# Patient Record
Sex: Male | Born: 2000
Health system: Southern US, Community
[De-identification: ages and names within clinical notes are randomized; demographics above are authoritative.]

## PROBLEM LIST (undated history)

## (undated) DIAGNOSIS — K5090 Crohn's disease, unspecified, without complications: Secondary | ICD-10-CM

---

## 2019-04-29 ENCOUNTER — Observation Stay (HOSPITAL_COMMUNITY)
Admission: EM | Admit: 2019-04-29 | Discharge: 2019-04-30 | Disposition: A | Discharge status: Home / Self Care | Payer: BC Managed Care – PPO | Attending: General Surgery | Admitting: General Surgery

## 2019-04-29 ENCOUNTER — Encounter (HOSPITAL_COMMUNITY): Payer: Self-pay | Admitting: Emergency Medicine

## 2019-04-29 ENCOUNTER — Other Ambulatory Visit: Payer: Self-pay

## 2019-04-29 ENCOUNTER — Emergency Department (HOSPITAL_COMMUNITY): Payer: BC Managed Care – PPO

## 2019-04-29 ENCOUNTER — Other Ambulatory Visit (HOSPITAL_COMMUNITY): Payer: Self-pay

## 2019-04-29 DIAGNOSIS — K5090 Crohn's disease, unspecified, without complications: Secondary | ICD-10-CM | POA: Insufficient documentation

## 2019-04-29 DIAGNOSIS — Z880 Allergy status to penicillin: Secondary | ICD-10-CM | POA: Diagnosis not present

## 2019-04-29 DIAGNOSIS — Z20828 Contact with and (suspected) exposure to other viral communicable diseases: Secondary | ICD-10-CM | POA: Insufficient documentation

## 2019-04-29 DIAGNOSIS — S31139A Puncture wound of abdominal wall without foreign body, unspecified quadrant without penetration into peritoneal cavity, initial encounter: Secondary | ICD-10-CM

## 2019-04-29 DIAGNOSIS — S27321A Contusion of lung, unilateral, initial encounter: Secondary | ICD-10-CM | POA: Diagnosis not present

## 2019-04-29 DIAGNOSIS — Y249XXA Unspecified firearm discharge, undetermined intent, initial encounter: Secondary | ICD-10-CM | POA: Insufficient documentation

## 2019-04-29 DIAGNOSIS — S21339A Puncture wound without foreign body of unspecified front wall of thorax with penetration into thoracic cavity, initial encounter: Secondary | ICD-10-CM

## 2019-04-29 DIAGNOSIS — R2681 Unsteadiness on feet: Secondary | ICD-10-CM | POA: Diagnosis not present

## 2019-04-29 DIAGNOSIS — S31030A Puncture wound without foreign body of lower back and pelvis without penetration into retroperitoneum, initial encounter: Secondary | ICD-10-CM | POA: Insufficient documentation

## 2019-04-29 DIAGNOSIS — Z560 Unemployment, unspecified: Secondary | ICD-10-CM | POA: Insufficient documentation

## 2019-04-29 DIAGNOSIS — S270XXA Traumatic pneumothorax, initial encounter: Principal | ICD-10-CM | POA: Insufficient documentation

## 2019-04-29 DIAGNOSIS — R739 Hyperglycemia, unspecified: Secondary | ICD-10-CM | POA: Diagnosis not present

## 2019-04-29 DIAGNOSIS — W3400XA Accidental discharge from unspecified firearms or gun, initial encounter: Secondary | ICD-10-CM

## 2019-04-29 DIAGNOSIS — J984 Other disorders of lung: Secondary | ICD-10-CM | POA: Diagnosis not present

## 2019-04-29 DIAGNOSIS — Y92219 Unspecified school as the place of occurrence of the external cause: Secondary | ICD-10-CM | POA: Insufficient documentation

## 2019-04-29 DIAGNOSIS — S31130A Puncture wound of abdominal wall without foreign body, right upper quadrant without penetration into peritoneal cavity, initial encounter: Secondary | ICD-10-CM | POA: Diagnosis not present

## 2019-04-29 DIAGNOSIS — S31134A Puncture wound of abdominal wall without foreign body, left lower quadrant without penetration into peritoneal cavity, initial encounter: Secondary | ICD-10-CM | POA: Insufficient documentation

## 2019-04-29 DIAGNOSIS — J939 Pneumothorax, unspecified: Secondary | ICD-10-CM

## 2019-04-29 HISTORY — DX: Crohn's disease, unspecified, without complications: K50.90

## 2019-04-29 LAB — COMPREHENSIVE METABOLIC PANEL
ALT: 15 U/L (ref 0–44)
AST: 22 U/L (ref 15–41)
Albumin: 4.1 g/dL (ref 3.5–5.0)
Alkaline Phosphatase: 52 U/L (ref 38–126)
Anion gap: 13 (ref 5–15)
BUN: 14 mg/dL (ref 6–20)
CO2: 22 mmol/L (ref 22–32)
Calcium: 9.2 mg/dL (ref 8.9–10.3)
Chloride: 103 mmol/L (ref 98–111)
Creatinine, Ser: 1.28 mg/dL — ABNORMAL HIGH (ref 0.61–1.24)
GFR calc Af Amer: >60 mL/min (ref >60)
GFR calc non Af Amer: >60 mL/min (ref >60)
Glucose, Bld: 163 mg/dL — ABNORMAL HIGH (ref 70–99)
Potassium: 3.6 mmol/L (ref 3.5–5.1)
Sodium: 138 mmol/L (ref 135–145)
Total Bilirubin: 0.7 mg/dL (ref 0.3–1.2)
Total Protein: 6.8 g/dL (ref 6.5–8.1)

## 2019-04-29 LAB — CBC
HCT: 46.7 % (ref 39.0–52.0)
Hemoglobin: 14.9 g/dL (ref 13.0–17.0)
MCH: 26.4 pg (ref 26.0–34.0)
MCHC: 31.9 g/dL (ref 30.0–36.0)
MCV: 82.8 fL (ref 80.0–100.0)
Platelets: 282 10*3/uL (ref 150–400)
RBC: 5.64 MIL/uL (ref 4.22–5.81)
RDW: 12.2 % (ref 11.5–15.5)
WBC: 10 10*3/uL (ref 4.0–10.5)
nRBC: 0 % (ref 0.0–0.2)

## 2019-04-29 LAB — I-STAT CHEM 8, ED
BUN: 15 mg/dL (ref 6–20)
Calcium, Ion: 1.11 mmol/L — ABNORMAL LOW (ref 1.15–1.40)
Chloride: 103 mmol/L (ref 98–111)
Creatinine, Ser: 1.2 mg/dL (ref 0.61–1.24)
Glucose, Bld: 154 mg/dL — ABNORMAL HIGH (ref 70–99)
HCT: 46 % (ref 39.0–52.0)
Hemoglobin: 15.6 g/dL (ref 13.0–17.0)
Potassium: 3.6 mmol/L (ref 3.5–5.1)
Sodium: 140 mmol/L (ref 135–145)
TCO2: 22 mmol/L (ref 22–32)

## 2019-04-29 LAB — PROTIME-INR
INR: 1.1 (ref 0.8–1.2)
Prothrombin Time: 14 seconds (ref 11.4–15.2)

## 2019-04-29 LAB — PREPARE FRESH FROZEN PLASMA
Unit division: 0
Unit division: 0

## 2019-04-29 LAB — BPAM FFP
Blood Product Expiration Date: 202010212359
Blood Product Expiration Date: 202010222359
ISSUE DATE / TIME: 202009301459
ISSUE DATE / TIME: 202009301500
Unit Type and Rh: 6200
Unit Type and Rh: 6200

## 2019-04-29 LAB — LACTIC ACID, PLASMA: Lactic Acid, Venous: 2 mmol/L (ref 0.5–1.9)

## 2019-04-29 LAB — ABO/RH: ABO/RH(D): O POS

## 2019-04-29 LAB — ETHANOL: Alcohol, Ethyl (B): <10 mg/dL (ref <10)

## 2019-04-29 LAB — SARS CORONAVIRUS 2 BY RT PCR (HOSPITAL ORDER, PERFORMED IN ~~LOC~~ HOSPITAL LAB): SARS Coronavirus 2: NEGATIVE

## 2019-04-29 LAB — CDS SEROLOGY

## 2019-04-29 MED ORDER — SODIUM CHLORIDE 0.9 % IV SOLN
INTRAVENOUS | Status: AC | PRN
  Administered 2019-04-29: 1000 mL via INTRAVENOUS

## 2019-04-29 MED ORDER — CEFAZOLIN SODIUM-DEXTROSE 1-4 GM/50ML-% IV SOLN
1.0000 g | Freq: Three times a day (TID) | INTRAVENOUS | Status: DC
  Administered 2019-04-30 (×2): 1 g via INTRAVENOUS
  Filled 2019-04-29 (×3): qty 50

## 2019-04-29 MED ORDER — HYDROMORPHONE HCL 1 MG/ML IJ SOLN
0.5000 mg | INTRAMUSCULAR | Status: DC | PRN
  Administered 2019-04-29 – 2019-04-30 (×5): 1 mg via INTRAVENOUS
  Filled 2019-04-29 (×4): qty 1

## 2019-04-29 MED ORDER — CEFAZOLIN SODIUM-DEXTROSE 2-4 GM/100ML-% IV SOLN
2.0000 g | Freq: Once | INTRAVENOUS | Status: AC
  Administered 2019-04-29: 15:00:00 2 g via INTRAVENOUS
  Filled 2019-04-29: qty 100

## 2019-04-29 MED ORDER — IOHEXOL 300 MG/ML  SOLN
100.0000 mL | Freq: Once | INTRAMUSCULAR | Status: AC | PRN
  Administered 2019-04-29: 100 mL via INTRAVENOUS

## 2019-04-29 MED ORDER — DOCUSATE SODIUM 100 MG PO CAPS
100.0000 mg | ORAL_CAPSULE | Freq: Two times a day (BID) | ORAL | Status: DC
  Administered 2019-04-29 – 2019-04-30 (×3): 100 mg via ORAL
  Filled 2019-04-29 (×3): qty 1

## 2019-04-29 MED ORDER — ONDANSETRON 4 MG PO TBDP: 4.0000 mg | ORAL_TABLET | Freq: Four times a day (QID) | ORAL | Status: DC | PRN

## 2019-04-29 MED ORDER — OXYCODONE HCL 5 MG PO TABS
5.0000 mg | ORAL_TABLET | ORAL | Status: DC | PRN
  Administered 2019-04-29 – 2019-04-30 (×4): 10 mg via ORAL
  Administered 2019-04-30: 5 mg via ORAL
  Filled 2019-04-29 (×2): qty 2
  Filled 2019-04-29: qty 1
  Filled 2019-04-29 (×2): qty 2

## 2019-04-29 MED ORDER — DIPHENHYDRAMINE HCL 25 MG PO CAPS: 25.0000 mg | ORAL_CAPSULE | Freq: Four times a day (QID) | ORAL | Status: DC | PRN

## 2019-04-29 MED ORDER — ONDANSETRON HCL 4 MG/2ML IJ SOLN
4.0000 mg | Freq: Four times a day (QID) | INTRAMUSCULAR | Status: DC | PRN
  Administered 2019-04-29: 4 mg via INTRAVENOUS
  Filled 2019-04-29: qty 2

## 2019-04-29 MED ORDER — ENOXAPARIN SODIUM 40 MG/0.4ML ~~LOC~~ SOLN
40.0000 mg | SUBCUTANEOUS | Status: DC
  Administered 2019-04-30: 40 mg via SUBCUTANEOUS
  Filled 2019-04-29: qty 0.4

## 2019-04-29 MED ORDER — HYDROMORPHONE HCL 1 MG/ML IJ SOLN
INTRAMUSCULAR | Status: AC
  Administered 2019-04-29: 16:00:00 1 mg via INTRAVENOUS
  Filled 2019-04-29: qty 1

## 2019-04-29 MED ORDER — SODIUM CHLORIDE 0.9 % IV SOLN
INTRAVENOUS | Status: DC
  Administered 2019-04-29: 19:00:00 via INTRAVENOUS

## 2019-04-29 MED ORDER — ACETAMINOPHEN 500 MG PO TABS
1000.0000 mg | ORAL_TABLET | Freq: Three times a day (TID) | ORAL | Status: DC
  Administered 2019-04-29 – 2019-04-30 (×3): 1000 mg via ORAL
  Filled 2019-04-29 (×3): qty 2

## 2019-04-29 MED ORDER — MORPHINE SULFATE 2 MG/ML IJ SOLN
INTRAMUSCULAR | Status: AC | PRN
  Administered 2019-04-29: 4 mg via INTRAVENOUS

## 2019-04-29 MED ORDER — MORPHINE SULFATE (PF) 4 MG/ML IV SOLN
INTRAVENOUS | Status: AC
  Filled 2019-04-29: qty 1

## 2019-04-29 MED ORDER — METOPROLOL TARTRATE 5 MG/5ML IV SOLN: 5.0000 mg | Freq: Four times a day (QID) | INTRAVENOUS | Status: DC | PRN

## 2019-04-29 NOTE — ED Notes (Signed)
RN attempted to call report to 6N. NS stated for me to call back in a few mins the nurse was busy.
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
## 2019-04-29 NOTE — ED Notes (Signed)
RN attempted to call report and 6N RN stated to call back in 5-10 mins.

## 2019-04-29 NOTE — ED Notes (Signed)
Family at bedside. 
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
## 2019-04-30 ENCOUNTER — Observation Stay (HOSPITAL_COMMUNITY): Payer: BC Managed Care – PPO

## 2019-04-30 LAB — TYPE AND SCREEN
ABO/RH(D): O POS
Antibody Screen: NEGATIVE
Unit division: 0
Unit division: 0

## 2019-04-30 LAB — URINALYSIS, ROUTINE W REFLEX MICROSCOPIC
Bilirubin Urine: NEGATIVE
Glucose, UA: NEGATIVE mg/dL
Hgb urine dipstick: NEGATIVE
Ketones, ur: NEGATIVE mg/dL
Leukocytes,Ua: NEGATIVE
Nitrite: NEGATIVE
Protein, ur: NEGATIVE mg/dL
Specific Gravity, Urine: 1.028 (ref 1.005–1.030)
pH: 6 (ref 5.0–8.0)

## 2019-04-30 LAB — BASIC METABOLIC PANEL
Anion gap: 15 (ref 5–15)
BUN: 11 mg/dL (ref 6–20)
CO2: 22 mmol/L (ref 22–32)
Calcium: 9.4 mg/dL (ref 8.9–10.3)
Chloride: 100 mmol/L (ref 98–111)
Creatinine, Ser: 0.95 mg/dL (ref 0.61–1.24)
GFR calc Af Amer: >60 mL/min (ref >60)
GFR calc non Af Amer: >60 mL/min (ref >60)
Glucose, Bld: 103 mg/dL — ABNORMAL HIGH (ref 70–99)
Potassium: 3.8 mmol/L (ref 3.5–5.1)
Sodium: 137 mmol/L (ref 135–145)

## 2019-04-30 LAB — CBC
HCT: 45.9 % (ref 39.0–52.0)
Hemoglobin: 15.6 g/dL (ref 13.0–17.0)
MCH: 27 pg (ref 26.0–34.0)
MCHC: 34 g/dL (ref 30.0–36.0)
MCV: 79.5 fL — ABNORMAL LOW (ref 80.0–100.0)
Platelets: 236 10*3/uL (ref 150–400)
RBC: 5.77 MIL/uL (ref 4.22–5.81)
RDW: 12.5 % (ref 11.5–15.5)
WBC: 10.2 10*3/uL (ref 4.0–10.5)
nRBC: 0 % (ref 0.0–0.2)

## 2019-04-30 LAB — BPAM RBC
Blood Product Expiration Date: 202011032359
Blood Product Expiration Date: 202011032359
ISSUE DATE / TIME: 202009302006
ISSUE DATE / TIME: 202009302331
Unit Type and Rh: 5100
Unit Type and Rh: 5100

## 2019-04-30 LAB — BLOOD PRODUCT ORDER (VERBAL) VERIFICATION

## 2019-04-30 MED ORDER — METHOCARBAMOL 500 MG PO TABS
500.0000 mg | ORAL_TABLET | Freq: Three times a day (TID) | ORAL | Status: DC
  Administered 2019-04-30: 500 mg via ORAL
  Filled 2019-04-30: qty 1

## 2019-04-30 MED ORDER — ACETAMINOPHEN 500 MG PO TABS: 1000.0000 mg | ORAL_TABLET | Freq: Three times a day (TID) | ORAL | 0 refills | Status: AC

## 2019-04-30 MED ORDER — METHOCARBAMOL 500 MG PO TABS: 500.0000 mg | ORAL_TABLET | Freq: Three times a day (TID) | ORAL | 0 refills | Status: AC

## 2019-04-30 MED ORDER — TRAMADOL HCL 50 MG PO TABS: 50.0000 mg | ORAL_TABLET | Freq: Four times a day (QID) | ORAL | Status: DC | PRN

## 2019-04-30 MED ORDER — OXYCODONE HCL 5 MG PO TABS: 5.0000 mg | ORAL_TABLET | ORAL | 0 refills | Status: AC | PRN

## 2019-04-30 MED FILL — oxyCODONE HCL 5 MG TABS: 5 | 4 days supply | Qty: 25 | Fill #0

## 2019-04-30 MED FILL — ACETAMINOPHEN EXTRA STRENGT: 500 | 5 days supply | Qty: 30 | Fill #0

## 2019-04-30 MED FILL — METHOCARBAMOL 500 MG TABS: 500 | 10 days supply | Qty: 30 | Fill #0
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
